# Patient Record
Sex: Male | Born: 1995 | Race: Black or African American | Hispanic: No | Marital: Single | Smoking: Former smoker
Health system: Southern US, Community
[De-identification: ages and names within clinical notes are randomized; demographics above are authoritative.]

## PROBLEM LIST (undated history)

## (undated) DIAGNOSIS — R51 Headache: Secondary | ICD-10-CM

## (undated) HISTORY — DX: Headache: R51

---
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
## 2017-08-04 ENCOUNTER — Other Ambulatory Visit: Payer: Self-pay

## 2017-08-04 ENCOUNTER — Emergency Department (HOSPITAL_COMMUNITY)
Admission: EM | Admit: 2017-08-04 | Discharge: 2017-08-05 | Disposition: A | Discharge status: Home / Self Care | Payer: 59 | Attending: Emergency Medicine | Admitting: Emergency Medicine

## 2017-08-04 ENCOUNTER — Encounter (HOSPITAL_COMMUNITY): Payer: Self-pay | Admitting: Emergency Medicine

## 2017-08-04 DIAGNOSIS — R197 Diarrhea, unspecified: Secondary | ICD-10-CM | POA: Diagnosis not present

## 2017-08-04 DIAGNOSIS — R112 Nausea with vomiting, unspecified: Secondary | ICD-10-CM | POA: Insufficient documentation

## 2017-08-04 MED ORDER — SODIUM CHLORIDE 0.9 % IV BOLUS
2000.0000 mL | Freq: Once | INTRAVENOUS | Status: AC
  Administered 2017-08-05: 2000 mL via INTRAVENOUS

## 2017-08-04 MED ORDER — ONDANSETRON HCL 4 MG/2ML IJ SOLN
4.0000 mg | Freq: Once | INTRAMUSCULAR | Status: AC
  Administered 2017-08-05: 4 mg via INTRAVENOUS
  Filled 2017-08-04: qty 2

## 2017-08-04 NOTE — ED Triage Notes (Signed)
Patient from home, started with nausea and vomiting at 330pm today and has not been able to keep anything down.  Patient is pale in triage.

## 2017-08-05 ENCOUNTER — Other Ambulatory Visit: Payer: Self-pay

## 2017-08-05 LAB — CBC
HEMATOCRIT: 46.6 % (ref 39.0–52.0)
HEMOGLOBIN: 16.2 g/dL (ref 13.0–17.0)
MCH: 30.5 pg (ref 26.0–34.0)
MCHC: 34.8 g/dL (ref 30.0–36.0)
MCV: 87.6 fL (ref 78.0–100.0)
Platelets: 304 10*3/uL (ref 150–400)
RBC: 5.32 MIL/uL (ref 4.22–5.81)
RDW: 12 % (ref 11.5–15.5)
WBC: 7.7 10*3/uL (ref 4.0–10.5)

## 2017-08-05 LAB — COMPREHENSIVE METABOLIC PANEL
ALT: 19 U/L (ref 17–63)
ANION GAP: 13 (ref 5–15)
AST: 25 U/L (ref 15–41)
Albumin: 4.3 g/dL (ref 3.5–5.0)
Alkaline Phosphatase: 71 U/L (ref 38–126)
BILIRUBIN TOTAL: 1.7 mg/dL — AB (ref 0.3–1.2)
BUN: 15 mg/dL (ref 6–20)
CO2: 22 mmol/L (ref 22–32)
Calcium: 9.4 mg/dL (ref 8.9–10.3)
Chloride: 104 mmol/L (ref 101–111)
Creatinine, Ser: 1.03 mg/dL (ref 0.61–1.24)
GFR calc Af Amer: >60 mL/min (ref >60)
Glucose, Bld: 117 mg/dL — ABNORMAL HIGH (ref 65–99)
POTASSIUM: 4.6 mmol/L (ref 3.5–5.1)
Sodium: 139 mmol/L (ref 135–145)
TOTAL PROTEIN: 7.6 g/dL (ref 6.5–8.1)

## 2017-08-05 LAB — URINALYSIS, ROUTINE W REFLEX MICROSCOPIC
BILIRUBIN URINE: NEGATIVE
Glucose, UA: NEGATIVE mg/dL
HGB URINE DIPSTICK: NEGATIVE
KETONES UR: 5 mg/dL — AB
NITRITE: NEGATIVE
Protein, ur: NEGATIVE mg/dL
SPECIFIC GRAVITY, URINE: 1.029 (ref 1.005–1.030)
pH: 7 (ref 5.0–8.0)

## 2017-08-05 LAB — LIPASE, BLOOD: Lipase: 22 U/L (ref 11–51)

## 2017-08-05 MED ORDER — ONDANSETRON 4 MG PO TBDP: ORAL_TABLET | ORAL | 0 refills | Status: DC

## 2017-08-05 MED ORDER — MORPHINE SULFATE (PF) 4 MG/ML IV SOLN
2.0000 mg | Freq: Once | INTRAVENOUS | Status: AC
  Administered 2017-08-05: 2 mg via INTRAVENOUS
  Filled 2017-08-05: qty 1

## 2017-08-05 NOTE — ED Notes (Signed)
Patient verbalizes understanding of discharge instructions. Opportunity for questioning and answers were provided. Armband removed by staff, pt discharged from ED with family.  

## 2017-08-05 NOTE — Discharge Instructions (Addendum)
1. Medications: zofran, usual home medications °2. Treatment: rest, drink plenty of fluids, advance diet slowly °3. Follow Up: Please followup with your primary doctor in 2 days for discussion of your diagnoses and further evaluation after today's visit; if you do not have a primary care doctor use the resource guide provided to find one; Please return to the ER for persistent vomiting, high fevers or worsening symptoms ° °

## 2017-08-05 NOTE — ED Provider Notes (Signed)
[facility] EMERGENCY DEPARTMENT Provider Note   CSN: [id number] Arrival date & time: 08/04/17  2343     History   Chief Complaint Chief Complaint  Patient presents with  . Emesis  . Nausea  . Diarrhea    HPI Seth Stevenson is a 22 y.o. male with a hx of migraine headache presents to the Emergency Department complaining of gradual, persistent, progressively worsening nausea, vomiting and diarrhea onset 3:30pm this afternoon.  Pt reports he was at work around lunchtime when he began having diarrhea.  He reports shortly after he began vomiting.  He reports numerous episodes of nonbloody and nonbilious emesis and watery diarrhea.  No history of abdominal surgeries, international travel or sick contacts.  He denies fevers or chills.  He endorses associated generalized abdominal discomfort and cramping.  Nothing makes his symptoms better.  He reports eating makes them worse.  No treatments prior to arrival.  The history is provided by the patient, medical records and a parent. No language interpreter was used.    Past Medical History:  Diagnosis Date  . IONGEXBM(841.3)     Patient Active Problem List   Diagnosis Date Noted  . Migraine variant 07/08/2012  . Migraine with aura 07/08/2012  . Migraine without aura 07/08/2012  . Episodic tension type headache 07/08/2012    Past Surgical History:  Procedure Laterality Date  . ADENOIDECTOMY Bilateral 2001        Home Medications    Prior to Admission medications   Medication Sig Start Date End Date Taking? Authorizing Provider  ondansetron (ZOFRAN ODT) 4 MG disintegrating tablet 4mg  ODT q4 hours prn nausea/vomit 08/05/17   Zoii Florer, Dahlia Client, PA-C    Family History Family History  Problem Relation Age of Onset  . Migraines Mother   . Heart attack Mother   . Migraines Maternal Grandmother   . Hypertension Maternal Grandmother   . Heart attack Maternal Grandmother   . Arthritis/Rheumatoid Maternal  Grandmother   . Diabetes Maternal Grandfather   . Heart attack Maternal Grandfather   . Arrhythmia Maternal Grandfather   . Hypertension Father   . Cancer Paternal Grandfather   . Migraines Cousin        maternal first cousin  . Migraines Unknown        maternal aunt onset in adolescence  . Glaucoma Unknown        maternal greatgrandfather  . Arrhythmia Maternal Aunt   . Lupus Maternal Aunt     Social History Social History   Tobacco Use  . Smoking status: Never Smoker  . Smokeless tobacco: Never Used  Substance Use Topics  . Alcohol use: No  . Drug use: No     Allergies   Patient has no known allergies.   Review of Systems Review of Systems  Constitutional: Negative for appetite change, diaphoresis, fatigue, fever and unexpected weight change.  HENT: Negative for mouth sores.   Eyes: Negative for visual disturbance.  Respiratory: Negative for cough, chest tightness, shortness of breath and wheezing.   Cardiovascular: Negative for chest pain.  Gastrointestinal: Positive for abdominal pain (cramping), diarrhea, nausea and vomiting. Negative for constipation.  Endocrine: Negative for polydipsia, polyphagia and polyuria.  Genitourinary: Negative for dysuria, frequency, hematuria and urgency.  Musculoskeletal: Negative for back pain and neck stiffness.  Skin: Negative for rash.  Allergic/Immunologic: Negative for immunocompromised state.  Neurological: Negative for syncope, light-headedness and headaches.  Hematological: Does not bruise/bleed easily.  Psychiatric/Behavioral: Negative for sleep disturbance. The patient is  not nervous/anxious.      Physical Exam Updated Vital Signs BP (!) 115/50 (BP Location: Right Arm)   Pulse 100   Temp 98.6 F (37 C) (Oral)   Resp 18   Ht 5\' 10"  (1.778 m)   Wt 77.1 kg (170 lb)   SpO2 99%   BMI 24.39 kg/m   Physical Exam  Constitutional: He appears well-developed and well-nourished. No distress.  Awake, alert, nontoxic  appearance  HENT:  Head: Normocephalic and atraumatic.  Mouth/Throat: Oropharynx is clear and moist. Mucous membranes are dry. No oropharyngeal exudate.  Eyes: Conjunctivae are normal. No scleral icterus.  Neck: Normal range of motion. Neck supple.  Cardiovascular: Normal rate, regular rhythm and intact distal pulses.  Pulmonary/Chest: Effort normal and breath sounds normal. No respiratory distress. He has no wheezes.  Equal chest expansion  Abdominal: Soft. Bowel sounds are normal. He exhibits no mass. There is no tenderness. There is no rebound and no guarding.  Musculoskeletal: Normal range of motion. He exhibits no edema.  Neurological: He is alert.  Speech is clear and goal oriented Moves extremities without ataxia  Skin: Skin is warm and dry. He is not diaphoretic.  Psychiatric: He has a normal mood and affect.  Nursing note and vitals reviewed.    ED Treatments / Results  Labs (all labs ordered are listed, but only abnormal results are displayed) Labs Reviewed  COMPREHENSIVE METABOLIC PANEL - Abnormal; Notable for the following components:      Result Value   Glucose, Bld 117 (*)    Total Bilirubin 1.7 (*)    All other components within normal limits  URINALYSIS, ROUTINE W REFLEX MICROSCOPIC - Abnormal; Notable for the following components:   Ketones, ur 5 (*)    Leukocytes, UA TRACE (*)    Bacteria, UA RARE (*)    Squamous Epithelial / LPF 0-5 (*)    All other components within normal limits  LIPASE, BLOOD  CBC     Procedures Procedures (including critical care time)  Medications Ordered in ED Medications  sodium chloride 0.9 % bolus 2,000 mL (0 mLs Intravenous Stopped 08/05/17 0231)  ondansetron (ZOFRAN) injection 4 mg (4 mg Intravenous Given 08/05/17 0016)  morphine 4 MG/ML injection 2 mg (2 mg Intravenous Given 08/05/17 0130)     Initial Impression / Assessment and Plan / ED Course  I have reviewed the triage vital signs and the nursing notes.  Pertinent  labs & imaging results that were available during my care of the patient were reviewed by me and considered in my medical decision making (see chart for details).  Clinical Course as of Aug 06 230  Wed Aug 05, 2017  0040 Labs reassuring.  No leukocytosis.  No elevation in lipase or AST/ALT.    [HM]  0040 Consistent with mild dehydration.  Ketones, ur(!): 5 [HM]  0040 Mild tachycardia  Pulse Rate: 100 [HM]  0219 Repeat abd exam is benign   [HM]    Clinical Course User Index [HM] Lucia Mccreadie, Dahlia Client, PA-C    Patient is well-appearing.  Mucous membranes are slightly dry.  Abdomen is soft without tenderness, guarding or rebound.  It is nondistended.  Will give fluids, Zofran, check labs and reassess.  2:31 AM Labs reassuring.  Abd remains nonfocal and is improved.  Patient with symptoms consistent with viral gastroenteritis.  Vitals are stable, no fever. Tolerating PO fluids.  Lungs are clear.  No focal abdominal pain, no concern for appendicitis, cholecystitis, pancreatitis, ruptured viscus, UTI, kidney  stone, or any other abdominal etiology.  Supportive therapy indicated with return if symptoms worsen.  Patient counseled.    Final Clinical Impressions(s) / ED Diagnoses   Final diagnoses:  Nausea vomiting and diarrhea    ED Discharge Orders        Ordered    ondansetron (ZOFRAN ODT) 4 MG disintegrating tablet     08/05/17 0230       Presten Joost, Dahlia ClientHannah, PA-C 08/05/17 0232    Glynn Octaveancour, Stephen, MD 08/05/17 [phone]

## 2017-08-05 NOTE — ED Notes (Signed)
ED Provider at bedside. 

## 2018-12-13 ENCOUNTER — Ambulatory Visit (HOSPITAL_COMMUNITY)
Admission: EM | Admit: 2018-12-13 | Discharge: 2018-12-13 | Disposition: A | Discharge status: Home / Self Care | Payer: Managed Care, Other (non HMO) | Attending: Emergency Medicine | Admitting: Emergency Medicine

## 2018-12-13 ENCOUNTER — Other Ambulatory Visit: Payer: Self-pay

## 2018-12-13 ENCOUNTER — Encounter (HOSPITAL_COMMUNITY): Payer: Self-pay | Admitting: Emergency Medicine

## 2018-12-13 DIAGNOSIS — Z202 Contact with and (suspected) exposure to infections with a predominantly sexual mode of transmission: Secondary | ICD-10-CM | POA: Diagnosis present

## 2018-12-13 DIAGNOSIS — Z7251 High risk heterosexual behavior: Secondary | ICD-10-CM

## 2018-12-13 MED ORDER — LIDOCAINE HCL (PF) 1 % IJ SOLN
INTRAMUSCULAR | Status: AC
  Filled 2018-12-13: qty 2

## 2018-12-13 MED ORDER — METRONIDAZOLE 500 MG PO TABS
ORAL_TABLET | ORAL | Status: AC
  Filled 2018-12-13: qty 4

## 2018-12-13 MED ORDER — CEFTRIAXONE SODIUM 250 MG IJ SOLR
250.0000 mg | Freq: Once | INTRAMUSCULAR | Status: AC
  Administered 2018-12-13: 250 mg via INTRAMUSCULAR

## 2018-12-13 MED ORDER — METRONIDAZOLE 500 MG PO TABS
ORAL_TABLET | ORAL | Status: AC
  Filled 2018-12-13: qty 1

## 2018-12-13 MED ORDER — CEFTRIAXONE SODIUM 250 MG IJ SOLR
INTRAMUSCULAR | Status: AC
  Filled 2018-12-13: qty 250

## 2018-12-13 MED ORDER — AZITHROMYCIN 250 MG PO TABS
ORAL_TABLET | ORAL | Status: AC
  Filled 2018-12-13: qty 4

## 2018-12-13 MED ORDER — AZITHROMYCIN 250 MG PO TABS
1000.0000 mg | ORAL_TABLET | Freq: Once | ORAL | Status: AC
  Administered 2018-12-13: 1000 mg via ORAL

## 2018-12-13 MED ORDER — METRONIDAZOLE 500 MG PO TABS
2000.0000 mg | ORAL_TABLET | Freq: Once | ORAL | Status: AC
  Administered 2018-12-13: 12:00:00 2000 mg via ORAL

## 2018-12-13 NOTE — ED Provider Notes (Signed)
[facility]    CSN: [id number] Arrival date & time: 12/13/18  1051     History   Chief Complaint Chief Complaint  Patient presents with  . SEXUALLY TRANSMITTED DISEASE    HPI Seth Stevenson is a 23 y.o. male.   Patient presents with request for STD testing and treatment.  He found out that his partner is positive for chlamydia and trichomonas; they did not use condoms.  He denies penile discharge, lesions, testicular pain, abdominal pain, dysuria, fever, or other symptoms.    The history is provided by the patient.    Past Medical History:  Diagnosis Date  . WUJWJXBJ(478.2Headache(784.0)     Patient Active Problem List   Diagnosis Date Noted  . Migraine variant 07/08/2012  . Migraine with aura 07/08/2012  . Migraine without aura 07/08/2012  . Episodic tension type headache 07/08/2012    Past Surgical History:  Procedure Laterality Date  . ADENOIDECTOMY Bilateral 2001       Home Medications    Prior to Admission medications   Medication Sig Start Date End Date Taking? Authorizing Provider  ondansetron (ZOFRAN ODT) 4 MG disintegrating tablet 4mg  ODT q4 hours prn nausea/vomit 08/05/17   Muthersbaugh, Dahlia ClientHannah, PA-C    Family History Family History  Problem Relation Age of Onset  . Migraines Mother   . Heart attack Mother   . Migraines Maternal Grandmother   . Hypertension Maternal Grandmother   . Heart attack Maternal Grandmother   . Arthritis/Rheumatoid Maternal Grandmother   . Diabetes Maternal Grandfather   . Heart attack Maternal Grandfather   . Arrhythmia Maternal Grandfather   . Hypertension Father   . Cancer Paternal Grandfather   . Migraines Cousin        maternal first cousin  . Migraines Other        maternal aunt onset in adolescence  . Glaucoma Other        maternal greatgrandfather  . Arrhythmia Maternal Aunt   . Lupus Maternal Aunt     Social History Social History   Tobacco Use  . Smoking status: Never Smoker  . Smokeless tobacco:  Never Used  Substance Use Topics  . Alcohol use: No  . Drug use: No     Allergies   Patient has no known allergies.   Review of Systems Review of Systems  Constitutional: Negative for chills and fever.  HENT: Negative for ear pain and sore throat.   Eyes: Negative for pain and visual disturbance.  Respiratory: Negative for cough and shortness of breath.   Cardiovascular: Negative for chest pain and palpitations.  Gastrointestinal: Negative for abdominal pain and vomiting.  Genitourinary: Negative for discharge, dysuria, flank pain, hematuria and testicular pain.  Musculoskeletal: Negative for arthralgias and back pain.  Skin: Negative for color change and rash.  Neurological: Negative for seizures and syncope.  All other systems reviewed and are negative.    Physical Exam Triage Vital Signs ED Triage Vitals  Enc Vitals Group     BP      Pulse      Resp      Temp      Temp src      SpO2      Weight      Height      Head Circumference      Peak Flow      Pain Score      Pain Loc      Pain Edu?  Excl. in [place]?    No data found.  Updated Vital Signs BP 106/62 (BP Location: Left Arm)   Pulse (!) 58   Temp 97.8 F (36.6 C) (Temporal)   Resp 16   SpO2 100%   Visual Acuity Right Eye Distance:   Left Eye Distance:   Bilateral Distance:    Right Eye Near:   Left Eye Near:    Bilateral Near:     Physical Exam Vitals signs and nursing note reviewed.  Constitutional:      Appearance: He is well-developed.  HENT:     Head: Normocephalic and atraumatic.  Eyes:     Conjunctiva/sclera: Conjunctivae normal.  Neck:     Musculoskeletal: Neck supple.  Cardiovascular:     Rate and Rhythm: Normal rate and regular rhythm.     Heart sounds: No murmur.  Pulmonary:     Effort: Pulmonary effort is normal. No respiratory distress.     Breath sounds: Normal breath sounds.  Abdominal:     Palpations: Abdomen is soft.     Tenderness: There is no abdominal  tenderness. There is no right CVA tenderness, left CVA tenderness, guarding or rebound.  Genitourinary:    Penis: Normal.      Scrotum/Testes: Normal.  Skin:    General: Skin is warm and dry.     Findings: No rash.  Neurological:     Mental Status: He is alert.      UC Treatments / Results  Labs (all labs ordered are listed, but only abnormal results are displayed) Labs Reviewed  RPR  HIV ANTIBODY (ROUTINE TESTING W REFLEX)  URINE CYTOLOGY ANCILLARY ONLY    EKG   Radiology No results found.  Procedures Procedures (including critical care time)  Medications Ordered in UC Medications  metroNIDAZOLE (FLAGYL) tablet 2,000 mg (has no administration in time range)  azithromycin (ZITHROMAX) tablet 1,000 mg (has no administration in time range)  cefTRIAXone (ROCEPHIN) injection 250 mg (has no administration in time range)    Initial Impression / Assessment and Plan / UC Course  I have reviewed the triage vital signs and the nursing notes.  Pertinent labs & imaging results that were available during my care of the patient were reviewed by me and considered in my medical decision making (see chart for details).    Unprotected sex, exposure to STD.  Treated today with Rocephin, Zithromax, metronidazole.  Urine sent for trichomonas, gonorrhea, chlamydia.  Blood sent for HIV and RPR.  Discussed safe sex practices with patient.  Discussed that his sexual partners will need to be treated if his test results come back positive.  Instructed patient to abstain from sex for 7 days.     Final Clinical Impressions(s) / UC Diagnoses   Final diagnoses:  Unprotected sex  Exposure to STD     Discharge Instructions     You were treated today for STDs; you were given Rocephin, Zithromax, and metronidazole.    We will call you if any of your STD tests come back positive.  All of your sexual partners will need to be treated if your tests are positive.          ED Prescriptions     None     Controlled Substance Prescriptions [place] Controlled Substance Registry consulted? Not Applicable   Sharion Balloon, NP 12/13/18 1124

## 2018-12-13 NOTE — Discharge Instructions (Addendum)
You were treated today for STDs; you were given Rocephin, Zithromax, and metronidazole.    We will call you if any of your STD tests come back positive.  All of your sexual partners will need to be treated if your tests are positive.

## 2018-12-13 NOTE — ED Triage Notes (Signed)
Patient denies symptoms: no burning, no discharge  Patient reports partner is positive for chlamydia and trich

## 2018-12-14 LAB — RPR: RPR Ser Ql: NONREACTIVE

## 2018-12-14 LAB — URINE CYTOLOGY ANCILLARY ONLY
Chlamydia: NEGATIVE
Neisseria Gonorrhea: NEGATIVE
Trichomonas: NEGATIVE

## 2018-12-14 LAB — HIV ANTIBODY (ROUTINE TESTING W REFLEX): HIV Screen 4th Generation wRfx: NONREACTIVE

## 2019-01-27 ENCOUNTER — Encounter (HOSPITAL_COMMUNITY): Payer: Self-pay | Admitting: Emergency Medicine

## 2019-01-27 ENCOUNTER — Other Ambulatory Visit: Payer: Self-pay

## 2019-01-27 ENCOUNTER — Ambulatory Visit (INDEPENDENT_AMBULATORY_CARE_PROVIDER_SITE_OTHER)
Admission: EM | Admit: 2019-01-27 | Discharge: 2019-01-27 | Disposition: A | Discharge status: Home / Self Care | Payer: Managed Care, Other (non HMO) | Source: Home / Self Care | Attending: Internal Medicine | Admitting: Internal Medicine

## 2019-01-27 ENCOUNTER — Emergency Department (HOSPITAL_COMMUNITY): Payer: Managed Care, Other (non HMO)

## 2019-01-27 ENCOUNTER — Emergency Department (HOSPITAL_COMMUNITY)
Admission: EM | Admit: 2019-01-27 | Discharge: 2019-01-27 | Disposition: A | Discharge status: Home / Self Care | Payer: Managed Care, Other (non HMO) | Attending: Emergency Medicine | Admitting: Emergency Medicine

## 2019-01-27 DIAGNOSIS — M940 Chondrocostal junction syndrome [Tietze]: Secondary | ICD-10-CM | POA: Diagnosis not present

## 2019-01-27 DIAGNOSIS — Z5321 Procedure and treatment not carried out due to patient leaving prior to being seen by health care provider: Secondary | ICD-10-CM | POA: Insufficient documentation

## 2019-01-27 DIAGNOSIS — R0789 Other chest pain: Secondary | ICD-10-CM | POA: Diagnosis present

## 2019-01-27 LAB — CBC
HCT: 44.2 % (ref 39.0–52.0)
Hemoglobin: 15.6 g/dL (ref 13.0–17.0)
MCH: 32.1 pg (ref 26.0–34.0)
MCHC: 35.3 g/dL (ref 30.0–36.0)
MCV: 90.9 fL (ref 80.0–100.0)
Platelets: 304 10*3/uL (ref 150–400)
RBC: 4.86 MIL/uL (ref 4.22–5.81)
RDW: 11.5 % (ref 11.5–15.5)
WBC: 4.9 10*3/uL (ref 4.0–10.5)
nRBC: 0 % (ref 0.0–0.2)

## 2019-01-27 LAB — BASIC METABOLIC PANEL
Anion gap: 11 (ref 5–15)
BUN: 17 mg/dL (ref 6–20)
CO2: 24 mmol/L (ref 22–32)
Calcium: 9.5 mg/dL (ref 8.9–10.3)
Chloride: 103 mmol/L (ref 98–111)
Creatinine, Ser: 0.93 mg/dL (ref 0.61–1.24)
GFR calc Af Amer: >60 mL/min (ref >60)
GFR calc non Af Amer: >60 mL/min (ref >60)
Glucose, Bld: 87 mg/dL (ref 70–99)
Potassium: 4 mmol/L (ref 3.5–5.1)
Sodium: 138 mmol/L (ref 135–145)

## 2019-01-27 LAB — TROPONIN I (HIGH SENSITIVITY)
Troponin I (High Sensitivity): 3 ng/L (ref <18)
Troponin I (High Sensitivity): 3 ng/L (ref <18)

## 2019-01-27 MED ORDER — IBUPROFEN 600 MG PO TABS: 600.0000 mg | ORAL_TABLET | Freq: Four times a day (QID) | ORAL | 0 refills | Status: DC | PRN

## 2019-01-27 MED ORDER — KETOROLAC TROMETHAMINE 30 MG/ML IJ SOLN
30.0000 mg | Freq: Once | INTRAMUSCULAR | Status: AC
  Administered 2019-01-27: 30 mg via INTRAMUSCULAR

## 2019-01-27 MED ORDER — KETOROLAC TROMETHAMINE 30 MG/ML IJ SOLN: 30.0000 mg | Freq: Once | INTRAMUSCULAR | Status: DC

## 2019-01-27 MED ORDER — KETOROLAC TROMETHAMINE 30 MG/ML IJ SOLN
INTRAMUSCULAR | Status: AC
  Filled 2019-01-27: qty 1

## 2019-01-27 NOTE — ED Notes (Signed)
Pt states he can't wait any longer and when he found there were several people still in front he states he is going to lay down. LWBS

## 2019-01-27 NOTE — ED Provider Notes (Signed)
[place]    CSN: [id number] Arrival date & time: 01/27/19  1100      History   Chief Complaint Chief Complaint  Patient presents with  . Chest Pain    HPI Seth Stevenson is a 23 y.o. male with past medical history comes to urgent care with complains of sharp precordial chest pain of 2-3 days duration. Pain is sharp, reproducible, worse on palpation and movement. Patient has not tried any over the counter medications.  No trauma to the chest. Patient was seen in the emergency department.  He had a CBC, BMP, troponins, chest x-ray done.  All these tests were negative.  Patient left before he was seen by provider.Marland Kitchen   HPI  Past Medical History:  Diagnosis Date  . HWEXHBZJ(696.7)     Patient Active Problem List   Diagnosis Date Noted  . Migraine variant 07/08/2012  . Migraine with aura 07/08/2012  . Migraine without aura 07/08/2012  . Episodic tension type headache 07/08/2012    Past Surgical History:  Procedure Laterality Date  . ADENOIDECTOMY Bilateral 2001       Home Medications    Prior to Admission medications   Medication Sig Start Date End Date Taking? Authorizing Provider  ibuprofen (ADVIL) 600 MG tablet Take 1 tablet (600 mg total) by mouth every 6 (six) hours as needed. 01/27/19   Chase Picket, MD  ondansetron (ZOFRAN ODT) 4 MG disintegrating tablet 4mg  ODT q4 hours prn nausea/vomit 08/05/17   Muthersbaugh, Jarrett Soho, PA-C    Family History Family History  Problem Relation Age of Onset  . Migraines Mother   . Heart attack Mother   . Migraines Maternal Grandmother   . Hypertension Maternal Grandmother   . Heart attack Maternal Grandmother   . Arthritis/Rheumatoid Maternal Grandmother   . Diabetes Maternal Grandfather   . Heart attack Maternal Grandfather   . Arrhythmia Maternal Grandfather   . Hypertension Father   . Cancer Paternal Grandfather   . Migraines Cousin        maternal first cousin  . Migraines Other        maternal  aunt onset in adolescence  . Glaucoma Other        maternal greatgrandfather  . Arrhythmia Maternal Aunt   . Lupus Maternal Aunt     Social History Social History   Tobacco Use  . Smoking status: Current Every Day Smoker  . Smokeless tobacco: Never Used  Substance Use Topics  . Alcohol use: Yes  . Drug use: No     Allergies   Patient has no known allergies.   Review of Systems Review of Systems  Constitutional: Negative.   HENT: Negative.   Respiratory: Negative for cough, chest tightness, shortness of breath and wheezing.   Cardiovascular: Positive for chest pain. Negative for palpitations.  Gastrointestinal: Negative.   Genitourinary: Negative.   Musculoskeletal: Negative.      Physical Exam Triage Vital Signs ED Triage Vitals [01/27/19 1122]  Enc Vitals Group     BP 138/74     Pulse Rate 60     Resp 17     Temp 97.6 F (36.4 C)     Temp Source Tympanic     SpO2 99 %     Weight      Height      Head Circumference      Peak Flow      Pain Score 9     Pain Loc  Pain Edu?      Excl. in [place]?    No data found.  Updated Vital Signs BP 138/74 (BP Location: Left Arm)   Pulse 60   Temp 97.6 F (36.4 C) (Tympanic)   Resp 17   SpO2 99%   Visual Acuity Right Eye Distance:   Left Eye Distance:   Bilateral Distance:    Right Eye Near:   Left Eye Near:    Bilateral Near:     Physical Exam Vitals signs and nursing note reviewed.  Constitutional:      Appearance: He is well-developed.  Cardiovascular:     Rate and Rhythm: Normal rate and regular rhythm.  Pulmonary:     Effort: Pulmonary effort is normal.     Breath sounds: Normal breath sounds.     Comments: Tenderness on palpation in the left costochondral area. Neurological:     Mental Status: He is alert.      UC Treatments / Results  Labs (all labs ordered are listed, but only abnormal results are displayed) Labs Reviewed - No data to display  EKG   Radiology Dg Chest 2 View   Result Date: 01/27/2019 CLINICAL DATA:  Initial evaluation for acute chest pain. EXAM: CHEST - 2 VIEW COMPARISON:  None. FINDINGS: The cardiac and mediastinal silhouettes are within normal limits. The lungs are normally inflated. No airspace consolidation, pleural effusion, or pulmonary edema is identified. There is no pneumothorax. No acute osseous abnormality identified. IMPRESSION: No active cardiopulmonary disease. Electronically Signed   By: Rise Mu M.D.   On: 01/27/2019 02:05    Procedures Procedures (including critical care time)  Medications Ordered in UC Medications  ketorolac (TORADOL) 30 MG/ML injection 30 mg (30 mg Intramuscular Given 01/27/19 1152)  ketorolac (TORADOL) 30 MG/ML injection (has no administration in time range)    Initial Impression / Assessment and Plan / UC Course  I have reviewed the triage vital signs and the nursing notes.  Pertinent labs & imaging results that were available during my care of the patient were reviewed by me and considered in my medical decision making (see chart for details).     1.  Costochondritis: Warm compress Ibuprofen 600 mg every 6 hours as needed Cardiac work-up was unremarkable. If patient experiences worsening of his symptoms he is advised to return to the urgent care to be reevaluated. Final Clinical Impressions(s) / UC Diagnoses   Final diagnoses:  Costochondritis   Discharge Instructions   None    ED Prescriptions    Medication Sig Dispense Auth. Provider   ibuprofen (ADVIL) 600 MG tablet Take 1 tablet (600 mg total) by mouth every 6 (six) hours as needed. 30 tablet Lamptey, Britta Mccreedy, MD     PDMP not reviewed this encounter.   Merrilee Jansky, MD 01/27/19 1430

## 2019-01-27 NOTE — ED Triage Notes (Signed)
C/o central and left sided chest tightness since Sunday.  Denies SOB, nausea, and vomiting.

## 2019-01-27 NOTE — ED Triage Notes (Signed)
Pt here for mid sternal CP that is worse with movement; pt seen in ED this am for same but he LWBS although he multiple normal blood draws

## 2019-08-10 ENCOUNTER — Emergency Department (HOSPITAL_COMMUNITY)
Admission: EM | Admit: 2019-08-10 | Discharge: 2019-08-11 | Disposition: A | Discharge status: Home / Self Care | Payer: Managed Care, Other (non HMO) | Attending: Emergency Medicine | Admitting: Emergency Medicine

## 2019-08-10 ENCOUNTER — Encounter (HOSPITAL_COMMUNITY): Payer: Self-pay | Admitting: Emergency Medicine

## 2019-08-10 DIAGNOSIS — R519 Headache, unspecified: Secondary | ICD-10-CM | POA: Insufficient documentation

## 2019-08-10 DIAGNOSIS — Z5321 Procedure and treatment not carried out due to patient leaving prior to being seen by health care provider: Secondary | ICD-10-CM | POA: Insufficient documentation
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
## 2019-12-21 ENCOUNTER — Other Ambulatory Visit: Payer: Self-pay

## 2019-12-21 ENCOUNTER — Encounter (HOSPITAL_COMMUNITY): Payer: Self-pay

## 2019-12-21 ENCOUNTER — Ambulatory Visit (HOSPITAL_COMMUNITY)
Admission: EM | Admit: 2019-12-21 | Discharge: 2019-12-21 | Disposition: A | Discharge status: Home / Self Care | Payer: Managed Care, Other (non HMO) | Attending: Family Medicine | Admitting: Family Medicine

## 2019-12-21 DIAGNOSIS — Z113 Encounter for screening for infections with a predominantly sexual mode of transmission: Secondary | ICD-10-CM | POA: Diagnosis present

## 2019-12-21 DIAGNOSIS — B356 Tinea cruris: Secondary | ICD-10-CM | POA: Diagnosis present

## 2019-12-21 LAB — HIV ANTIBODY (ROUTINE TESTING W REFLEX): HIV Screen 4th Generation wRfx: NONREACTIVE

## 2019-12-21 NOTE — ED Provider Notes (Signed)
[facility]   [id number] 12/21/19 Arrival Time: 0913  ASSESSMENT & PLAN:  1. Tinea cruris       Discharge Instructions     Begin using over the counter Clotrimazole cream twice daily.  We have sent testing for sexually transmitted infections. We will notify you of any positive results once they are received. If required, we will prescribe any medications you might need.  Please refrain from all sexual activity for at least the next seven days.      Pending: Labs Reviewed  RPR  HIV ANTIBODY (ROUTINE TESTING W REFLEX)  CYTOLOGY, (ORAL, ANAL, URETHRAL) ANCILLARY ONLY    Keep skin clean and dry. Will follow up with PCP or here if worsening or failing to improve as anticipated. Reviewed expectations re: course of current medical issues. Questions answered. Outlined signs and symptoms indicating need for more acute intervention. Patient verbalized understanding. After Visit Summary given.   SUBJECTIVE:  Seth Stevenson is a 24 y.o. male who presents with a skin complaint. Rash; left groin; several days to week; sweats a lot at work; area is irritated/itchy at times. Afebrile. No h/o similar. OTC Neosporin without relief. Also requests STI testing; no symptoms. One male partner.   OBJECTIVE: Vitals:   12/21/19 1006 12/21/19 1007  BP: 135/75   Pulse: (!) 55   Resp: 16   Temp: 98 F (36.7 C)   TempSrc: Oral   SpO2: 100%   Weight:  77.1 kg  Height:  5\' 10"  (1.778 m)    General appearance: alert; no distress HEENT: [place]; AT Neck: supple with FROM Lungs: unlabored Extremities: no edema; moves all extremities normally Skin: warm and dry; signs of infection: no; left groin with scaly patches of irritated/broken skin consistent with tinea GU: normal Psychological: alert and cooperative; normal mood and affect  No Known Allergies  Past Medical History:  Diagnosis Date  . Headache(784.0)    Social History   Socioeconomic History  . Marital status:  Single    Spouse name: Not on file  . Number of children: Not on file  . Years of education: Not on file  . Highest education level: Not on file  Occupational History  . Not on file  Tobacco Use  . Smoking status: Former  . Smokeless tobacco: Never Used  Substance and Sexual Activity  . Alcohol use: Yes    Alcohol/week: 8.0 standard drinks    Types: 8 Shots of liquor per week  . Drug use: No  . Sexual activity: Never  Other Topics Concern  . Not on file  Social History Narrative  . Not on file   Social Determinants of Health   Financial Resource Strain:   . Difficulty of Paying Living Expenses:   Food Insecurity:   . Worried About Games developer in the Last Year:   . Programme researcher, broadcasting/film/video in the Last Year:   Transportation Needs:   . Barista (Medical):   Freight forwarder Lack of Transportation (Non-Medical):   Physical Activity:   . Days of Exercise per Week:   . Minutes of Exercise per Session:   Stress:   . Feeling of Stress :   Social Connections:   . Frequency of Communication with Friends and Family:   . Frequency of Social Gatherings with Friends and Family:   . Attends Religious Services:   . Active Member of Clubs or Organizations:   . Attends Marland Kitchen Meetings:   Banker Marital Status:  Intimate Partner Violence:   . Fear of Current or Ex-Partner:   . Emotionally Abused:   Marland Kitchen Physically Abused:   . Sexually Abused:    Family History  Problem Relation Age of Onset  . Migraines Mother   . Heart attack Mother   . Migraines Maternal Grandmother   . Hypertension Maternal Grandmother   . Heart attack Maternal Grandmother   . Arthritis/Rheumatoid Maternal Grandmother   . Diabetes Maternal Grandfather   . Heart attack Maternal Grandfather   . Arrhythmia Maternal Grandfather   . Hypertension Father   . Cancer Paternal Grandfather   . Migraines Cousin        maternal first cousin  . Migraines Other        maternal aunt onset in adolescence    . Glaucoma Other        maternal greatgrandfather  . Arrhythmia Maternal Aunt   . Lupus Maternal Aunt    Past Surgical History:  Procedure Laterality Date  . ADENOIDECTOMY Bilateral 2001     Caralynn Gelber, MD 12/21/19 1021

## 2019-12-21 NOTE — Discharge Instructions (Addendum)
Begin using over the counter Clotrimazole cream twice daily.  We have sent testing for sexually transmitted infections. We will notify you of any positive results once they are received. If required, we will prescribe any medications you might need.  Please refrain from all sexual activity for at least the next seven days.

## 2019-12-21 NOTE — ED Notes (Signed)
Discussed activation of my chart account

## 2019-12-21 NOTE — ED Triage Notes (Signed)
Pt c/o poss poison oak on left groin areax2 days. Pt states was doing yard work. Pt has purplish area in left groin area. Pt states it itches sometimes.

## 2019-12-22 LAB — CYTOLOGY, (ORAL, ANAL, URETHRAL) ANCILLARY ONLY
Chlamydia: NEGATIVE
Comment: NEGATIVE
Comment: NEGATIVE
Comment: NORMAL
Neisseria Gonorrhea: NEGATIVE
Trichomonas: NEGATIVE

## 2019-12-22 LAB — RPR: RPR Ser Ql: NONREACTIVE — AB

## 2020-01-16 ENCOUNTER — Ambulatory Visit (HOSPITAL_COMMUNITY)
Admission: EM | Admit: 2020-01-16 | Discharge: 2020-01-16 | Disposition: A | Discharge status: Home / Self Care | Payer: Managed Care, Other (non HMO) | Attending: Family Medicine | Admitting: Family Medicine

## 2020-01-16 ENCOUNTER — Encounter (HOSPITAL_COMMUNITY): Payer: Self-pay | Admitting: Emergency Medicine

## 2020-01-16 ENCOUNTER — Other Ambulatory Visit: Payer: Self-pay

## 2020-01-16 DIAGNOSIS — B356 Tinea cruris: Secondary | ICD-10-CM | POA: Diagnosis not present

## 2020-01-16 NOTE — ED Triage Notes (Signed)
Patient has a recurrent groin rash.  Patient said it did improve last visit.  But, rash has returned and thinks it is something else

## 2020-01-17 NOTE — ED Provider Notes (Signed)
[facility]   [id number] 01/16/20 Arrival Time: 1122  ASSESSMENT & PLAN:  1. Tinea cruris     Has resolved. Answered questions regarding his worries over hsv. No signs of hsv.   Reviewed expectations re: course of current medical issues. Questions answered. Outlined signs and symptoms indicating need for more acute intervention. Patient verbalized understanding. After Visit Summary given.   SUBJECTIVE:  Seth Stevenson is a 24 y.o. male who is here for f/u tinea cruris. Has resolved. Reports only sexual partner was dx with hsv. He has no lesions but has questions regarding hsv.     OBJECTIVE: Vitals:   01/16/20 1358  BP: 121/74  Pulse: (!) 54  Resp: 18  Temp: 98.5 F (36.9 C)  TempSrc: Oral  SpO2: 100%    General appearance: alert; no distress HEENT: [place]; AT Neck: supple with FROM Lungs: clear to auscultation bilaterally Heart: regular rate and rhythm Extremities: no edema; moves all extremities normally Skin: warm and dry; skin darkening around areas where tinea cruris was; no signs of active infection; no lesions Psychological: alert and cooperative; normal mood and affect  No Known Allergies  Past Medical History:  Diagnosis Date  . Headache(784.0)    Social History   Socioeconomic History  . Marital status: Single    Spouse name: Not on file  . Number of children: Not on file  . Years of education: Not on file  . Highest education level: Not on file  Occupational History  . Not on file  Tobacco Use  . Smoking status: Former Games developer  . Smokeless tobacco: Never Used  Substance and Sexual Activity  . Alcohol use: Yes    Alcohol/week: 8.0 standard drinks    Types: 8 Shots of liquor per week  . Drug use: No  . Sexual activity: Never  Other Topics Concern  . Not on file  Social History Narrative  . Not on file   Social Determinants of Health   Financial Resource Strain:   . Difficulty of Paying Living Expenses: Not on file  Food  Insecurity:   . Worried About Programme researcher, broadcasting/film/video in the Last Year: Not on file  . Ran Out of Food in the Last Year: Not on file  Transportation Needs:   . Lack of Transportation (Medical): Not on file  . Lack of Transportation (Non-Medical): Not on file  Physical Activity:   . Days of Exercise per Week: Not on file  . Minutes of Exercise per Session: Not on file  Stress:   . Feeling of Stress : Not on file  Social Connections:   . Frequency of Communication with Friends and Family: Not on file  . Frequency of Social Gatherings with Friends and Family: Not on file  . Attends Religious Services: Not on file  . Active Member of Clubs or Organizations: Not on file  . Attends Banker Meetings: Not on file  . Marital Status: Not on file  Intimate Partner Violence:   . Fear of Current or Ex-Partner: Not on file  . Emotionally Abused: Not on file  . Physically Abused: Not on file  . Sexually Abused: Not on file   Family History  Problem Relation Age of Onset  . Migraines Mother   . Heart attack Mother   . Migraines Maternal Grandmother   . Hypertension Maternal Grandmother   . Heart attack Maternal Grandmother   . Arthritis/Rheumatoid Maternal Grandmother   . Diabetes Maternal Grandfather   . Heart  attack Maternal Grandfather   . Arrhythmia Maternal Grandfather   . Hypertension Father   . Cancer Paternal Grandfather   . Migraines Cousin        maternal first cousin  . Migraines Other        maternal aunt onset in adolescence  . Glaucoma Other        maternal greatgrandfather  . Arrhythmia Maternal Aunt   . Lupus Maternal Aunt    Past Surgical History:  Procedure Laterality Date  . ADENOIDECTOMY Bilateral Sheral Flow, MD 01/17/20 [phone]

## 2020-02-17 ENCOUNTER — Encounter (HOSPITAL_COMMUNITY): Payer: Self-pay | Admitting: Emergency Medicine

## 2020-02-17 ENCOUNTER — Ambulatory Visit (HOSPITAL_COMMUNITY)
Admission: EM | Admit: 2020-02-17 | Discharge: 2020-02-17 | Disposition: A | Discharge status: Home / Self Care | Payer: Managed Care, Other (non HMO) | Attending: Emergency Medicine | Admitting: Emergency Medicine

## 2020-02-17 ENCOUNTER — Other Ambulatory Visit: Payer: Self-pay

## 2020-02-17 DIAGNOSIS — Z202 Contact with and (suspected) exposure to infections with a predominantly sexual mode of transmission: Secondary | ICD-10-CM | POA: Diagnosis not present

## 2020-02-17 DIAGNOSIS — Z113 Encounter for screening for infections with a predominantly sexual mode of transmission: Secondary | ICD-10-CM | POA: Diagnosis not present

## 2020-02-17 LAB — POCT URINALYSIS DIPSTICK, ED / UC
Bilirubin Urine: NEGATIVE
Glucose, UA: NEGATIVE mg/dL
Hgb urine dipstick: NEGATIVE
Ketones, ur: NEGATIVE mg/dL
Leukocytes,Ua: NEGATIVE
Nitrite: NEGATIVE
Protein, ur: NEGATIVE mg/dL
Specific Gravity, Urine: 1.025 (ref 1.005–1.030)
Urobilinogen, UA: 0.2 mg/dL (ref 0.0–1.0)
pH: 7 (ref 5.0–8.0)

## 2020-02-17 MED ORDER — VALACYCLOVIR HCL 1 G PO TABS: 1000.0000 mg | ORAL_TABLET | Freq: Two times a day (BID) | ORAL | 0 refills | Status: AC

## 2020-02-17 NOTE — ED Provider Notes (Signed)
Emergency Department Provider Note  ____________________________________________  Time seen: Approximately 11:22 AM  I have reviewed the triage vital signs and the nursing notes.   HISTORY  Chief Complaint Exposure to STD   Historian Patient    HPI Seth Stevenson is a 24 y.o. male presents to the emergency department with an erythematous, vesicular rash of penis after his girlfriend wrote him a note that said that she had genital herpes. Patient denies discharge from the penis, dysuria, increased urinary frequency or low back pain. He has been afebrile at home. He states that affected area presents with a burning type pain. He denies any history of genital herpes in the past. No other alleviating measures have been attempted.   Past Medical History:  Diagnosis Date  . Headache(784.0)      Immunizations up to date:  Yes.     Past Medical History:  Diagnosis Date  . BMWUXLKG(401.0)     Patient Active Problem List   Diagnosis Date Noted  . Migraine variant 07/08/2012  . Migraine with aura 07/08/2012  . Migraine without aura 07/08/2012  . Episodic tension type headache 07/08/2012    Past Surgical History:  Procedure Laterality Date  . ADENOIDECTOMY Bilateral 2001    Prior to Admission medications   Medication Sig Start Date End Date Taking? Authorizing Provider  ibuprofen (ADVIL) 600 MG tablet Take 1 tablet (600 mg total) by mouth every 6 (six) hours as needed. 01/27/19   Lamptey, Britta Mccreedy, MD  valACYclovir (VALTREX) 1000 MG tablet Take 1 tablet (1,000 mg total) by mouth 2 (two) times daily for 10 days. 02/17/20 02/27/20  Orvil Feil, PA-C    Allergies Patient has no known allergies.  Family History  Problem Relation Age of Onset  . Migraines Mother   . Heart attack Mother   . Migraines Maternal Grandmother   . Hypertension Maternal Grandmother   . Heart attack Maternal Grandmother   . Arthritis/Rheumatoid Maternal Grandmother   . Diabetes  Maternal Grandfather   . Heart attack Maternal Grandfather   . Arrhythmia Maternal Grandfather   . Hypertension Father   . Cancer Paternal Grandfather   . Migraines Cousin        maternal first cousin  . Migraines Other        maternal aunt onset in adolescence  . Glaucoma Other        maternal greatgrandfather  . Arrhythmia Maternal Aunt   . Lupus Maternal Aunt     Social History Social History   Tobacco Use  . Smoking status: Former Games developer  . Smokeless tobacco: Never Used  Substance Use Topics  . Alcohol use: Yes    Alcohol/week: 8.0 standard drinks    Types: 8 Shots of liquor per week  . Drug use: No     Review of Systems  Constitutional: No fever/chills Eyes:  No discharge ENT: No upper respiratory complaints. Respiratory: no cough. No SOB/ use of accessory muscles to breath Gastrointestinal:   No nausea, no vomiting.  No diarrhea.  No constipation. Musculoskeletal: Negative for musculoskeletal pain. Skin: Patient has rash.    ____________________________________________   PHYSICAL EXAM:  VITAL SIGNS: ED Triage Vitals [02/17/20 0930]  Enc Vitals Group     BP 124/78     Pulse Rate (!) 56     Resp 16     Temp 98.3 F (36.8 C)     Temp Source Oral     SpO2 98 %     Weight  Height      Head Circumference      Peak Flow      Pain Score 0     Pain Loc      Pain Edu?      Excl. in GC?      Constitutional: Alert and oriented. Well appearing and in no acute distress. Eyes: Conjunctivae are normal. PERRL. EOMI. Head: Atraumatic. Cardiovascular: Normal rate, regular rhythm. Normal S1 and S2.  Good peripheral circulation. Respiratory: Normal respiratory effort without tachypnea or retractions. Lungs CTAB. Good air entry to the bases with no decreased or absent breath sounds Gastrointestinal: Bowel sounds x 4 quadrants. Soft and nontender to palpation. No guarding or rigidity. No distention. Musculoskeletal: Full range of motion to all extremities. No  obvious deformities noted Neurologic:  Normal for age. No gross focal neurologic deficits are appreciated.  Skin: Patient has erythematous, vesicular rash of penis. Psychiatric: Mood and affect are normal for age. Speech and behavior are normal.   ____________________________________________   LABS (all labs ordered are listed, but only abnormal results are displayed)  Labs Reviewed  URINE CULTURE  POCT URINALYSIS DIPSTICK, ED / UC  CYTOLOGY, (ORAL, ANAL, URETHRAL) ANCILLARY ONLY   ____________________________________________  EKG   ____________________________________________  RADIOLOGY   No results found.  ____________________________________________    PROCEDURES  Procedure(s) performed:     Procedures     Medications - No data to display   ____________________________________________   INITIAL IMPRESSION / ASSESSMENT AND PLAN / ED COURSE  Pertinent labs & imaging results that were available during my care of the patient were reviewed by me and considered in my medical decision making (see chart for details).      Assessment and plan Genital herpes 24 year old male presents to the emergency department with an erythematous, vesicular rash to penis and recent contact with a sexual partner who is known to have genital herpes. Sendoff testing for gonorrhea, chlamydia and trichomoniasis is in process at this time as well as urine culture. Urinalysis did not suggest UTI. Patient would like to wait before empiric treatment for gonorrhea, chlamydia and trichomoniasis. Patient was started on Valtrex twice daily for the next 10 days for likely genital herpes. Return precautions were given to return with new or worsening symptoms.    ____________________________________________  FINAL CLINICAL IMPRESSION(S) / ED DIAGNOSES  Final diagnoses:  STD exposure      NEW MEDICATIONS STARTED DURING THIS VISIT:  ED Discharge Orders         Ordered     valACYclovir (VALTREX) 1000 MG tablet  2 times daily        02/17/20 1030              This chart was dictated using voice recognition software/Dragon. Despite best efforts to proofread, errors can occur which can change the meaning. Any change was purely unintentional.     Orvil Feil, PA-C 02/17/20 1126

## 2020-02-17 NOTE — ED Triage Notes (Signed)
Pt presents for std testing after possible exposure.

## 2020-02-17 NOTE — Discharge Instructions (Signed)
Take Valtrex twice daily for the next 10 days. If testing at urgent care is positive, you will have to return for treatment.

## 2020-02-18 LAB — URINE CULTURE: Culture: <10000 — AB

## 2020-02-20 LAB — CYTOLOGY, (ORAL, ANAL, URETHRAL) ANCILLARY ONLY
Chlamydia: NEGATIVE
Comment: NEGATIVE
Comment: NEGATIVE
Comment: NORMAL
Neisseria Gonorrhea: NEGATIVE
Trichomonas: NEGATIVE

## 2020-03-11 ENCOUNTER — Encounter (HOSPITAL_COMMUNITY): Payer: Self-pay | Admitting: Emergency Medicine

## 2020-03-11 ENCOUNTER — Emergency Department (HOSPITAL_COMMUNITY)
Admission: EM | Admit: 2020-03-11 | Discharge: 2020-03-11 | Disposition: A | Discharge status: Home / Self Care | Payer: Managed Care, Other (non HMO) | Attending: Emergency Medicine | Admitting: Emergency Medicine

## 2020-03-11 ENCOUNTER — Emergency Department (HOSPITAL_COMMUNITY): Payer: Managed Care, Other (non HMO)

## 2020-03-11 ENCOUNTER — Other Ambulatory Visit: Payer: Self-pay

## 2020-03-11 DIAGNOSIS — R1030 Lower abdominal pain, unspecified: Secondary | ICD-10-CM | POA: Diagnosis present

## 2020-03-11 DIAGNOSIS — R599 Enlarged lymph nodes, unspecified: Secondary | ICD-10-CM

## 2020-03-11 DIAGNOSIS — Z87891 Personal history of nicotine dependence: Secondary | ICD-10-CM | POA: Insufficient documentation

## 2020-03-11 LAB — CBC WITH DIFFERENTIAL/PLATELET
Abs Immature Granulocytes: 0.02 10*3/uL (ref 0.00–0.07)
Basophils Absolute: 0 10*3/uL (ref 0.0–0.1)
Basophils Relative: 0 %
Eosinophils Absolute: 0.2 10*3/uL (ref 0.0–0.5)
Eosinophils Relative: 2 %
HCT: 44.2 % (ref 39.0–52.0)
Hemoglobin: 15.1 g/dL (ref 13.0–17.0)
Immature Granulocytes: 0 %
Lymphocytes Relative: 18 %
Lymphs Abs: 1.5 10*3/uL (ref 0.7–4.0)
MCH: 31.6 pg (ref 26.0–34.0)
MCHC: 34.2 g/dL (ref 30.0–36.0)
MCV: 92.5 fL (ref 80.0–100.0)
Monocytes Absolute: 0.7 10*3/uL (ref 0.1–1.0)
Monocytes Relative: 9 %
Neutro Abs: 5.7 10*3/uL (ref 1.7–7.7)
Neutrophils Relative %: 71 %
Platelets: 304 10*3/uL (ref 150–400)
RBC: 4.78 MIL/uL (ref 4.22–5.81)
RDW: 11.9 % (ref 11.5–15.5)
WBC: 8.2 10*3/uL (ref 4.0–10.5)
nRBC: 0 % (ref 0.0–0.2)

## 2020-03-11 LAB — URINALYSIS, ROUTINE W REFLEX MICROSCOPIC
Bilirubin Urine: NEGATIVE
Glucose, UA: NEGATIVE mg/dL
Hgb urine dipstick: NEGATIVE
Ketones, ur: NEGATIVE mg/dL
Leukocytes,Ua: NEGATIVE
Nitrite: NEGATIVE
Protein, ur: NEGATIVE mg/dL
Specific Gravity, Urine: >1.046 — ABNORMAL HIGH (ref 1.005–1.030)
pH: 7 (ref 5.0–8.0)

## 2020-03-11 LAB — COMPREHENSIVE METABOLIC PANEL
ALT: 19 U/L (ref 0–44)
AST: 21 U/L (ref 15–41)
Albumin: 3.7 g/dL (ref 3.5–5.0)
Alkaline Phosphatase: 58 U/L (ref 38–126)
Anion gap: 10 (ref 5–15)
BUN: 13 mg/dL (ref 6–20)
CO2: 25 mmol/L (ref 22–32)
Calcium: 9.2 mg/dL (ref 8.9–10.3)
Chloride: 102 mmol/L (ref 98–111)
Creatinine, Ser: 0.93 mg/dL (ref 0.61–1.24)
GFR, Estimated: >60 mL/min (ref >60)
Glucose, Bld: 107 mg/dL — ABNORMAL HIGH (ref 70–99)
Potassium: 4 mmol/L (ref 3.5–5.1)
Sodium: 137 mmol/L (ref 135–145)
Total Bilirubin: 0.5 mg/dL (ref 0.3–1.2)
Total Protein: 6.5 g/dL (ref 6.5–8.1)

## 2020-03-11 MED ORDER — KETOROLAC TROMETHAMINE 30 MG/ML IJ SOLN
15.0000 mg | Freq: Once | INTRAMUSCULAR | Status: AC
  Administered 2020-03-11: 15 mg via INTRAVENOUS
  Filled 2020-03-11: qty 1

## 2020-03-11 MED ORDER — OXYCODONE-ACETAMINOPHEN 5-325 MG PO TABS
2.0000 | ORAL_TABLET | Freq: Once | ORAL | Status: AC
  Administered 2020-03-11: 2 via ORAL
  Filled 2020-03-11: qty 2

## 2020-03-11 MED ORDER — VALACYCLOVIR HCL 1 G PO TABS: 500.0000 mg | ORAL_TABLET | Freq: Two times a day (BID) | ORAL | 0 refills | Status: AC

## 2020-03-11 MED ORDER — IOHEXOL 300 MG/ML  SOLN
100.0000 mL | Freq: Once | INTRAMUSCULAR | Status: AC | PRN
  Administered 2020-03-11: 100 mL via INTRAVENOUS

## 2020-03-11 NOTE — Discharge Instructions (Signed)
Please return for any problem.  Take Valtrex as prescribed when indicated.

## 2020-03-11 NOTE — ED Provider Notes (Signed)
[facility] EMERGENCY DEPARTMENT Provider Note   CSN: [id number] Arrival date & time: 03/11/20  0455     History Chief Complaint  Patient presents with  . Abdominal Pain  . Inguinal Hernia    Seth Stevenson is a 24 y.o. male.  24 year old male with prior medical history as detailed below presents for evaluation of left inguinal pain and bulging.  Patient reports symptoms started on Thursday of this past week.  He does report significant heavy lifting at work.  He reports increased pain over the last day.  He did not attempt to take any medications at home for his pain.  He denies associated nausea, vomiting, abdominal pain, urinary symptoms, penile discharge, bowel movement change, or other complaint.  He denies prior history of hernia.  He denies prior history of STD.  He denies concern for new STD.  The history is provided by the patient and medical records.  Abdominal Pain Pain location: Left inguinal pain. Pain quality: aching   Pain radiates to:  Does not radiate Pain severity:  Mild Onset quality:  Gradual Duration:  4 days Timing:  Constant Progression:  Unchanged Chronicity:  New Relieved by:  Nothing Worsened by:  Nothing      Past Medical History:  Diagnosis Date  . [id number](294.7)     Patient Active Problem List   Diagnosis Date Noted  . Migraine variant 07/08/2012  . Migraine with aura 07/08/2012  . Migraine without aura 07/08/2012  . Episodic tension type headache 07/08/2012    Past Surgical History:  Procedure Laterality Date  . ADENOIDECTOMY Bilateral 2001       Family History  Problem Relation Age of Onset  . Migraines Mother   . Heart attack Mother   . Migraines Maternal Grandmother   . Hypertension Maternal Grandmother   . Heart attack Maternal Grandmother   . Arthritis/Rheumatoid Maternal Grandmother   . Diabetes Maternal Grandfather   . Heart attack Maternal Grandfather   . Arrhythmia Maternal Grandfather   .  Hypertension Father   . Cancer Paternal Grandfather   . Migraines Cousin        maternal first cousin  . Migraines Other        maternal aunt onset in adolescence  . Glaucoma Other        maternal greatgrandfather  . Arrhythmia Maternal Aunt   . Lupus Maternal Aunt     Social History   Tobacco Use  . Smoking status: Former Games developer  . Smokeless tobacco: Never Used  Substance Use Topics  . Alcohol use: Yes    Alcohol/week: 8.0 standard drinks    Types: 8 Shots of liquor per week  . Drug use: No    Home Medications Prior to Admission medications   Medication Sig Start Date End Date Taking? Authorizing Provider  ibuprofen (ADVIL) 600 MG tablet Take 1 tablet (600 mg total) by mouth every 6 (six) hours as needed. 01/27/19   Lamptey, Britta Mccreedy, MD    Allergies    Patient has no known allergies.  Review of Systems   Review of Systems  Gastrointestinal: Positive for abdominal pain.  All other systems reviewed and are negative.   Physical Exam Updated Vital Signs BP (!) 145/61 (BP Location: Left Arm)   Pulse 78   Temp 97.6 F (36.4 C) (Oral)   Resp 16   Ht 5\' 10"  (1.778 m)   Wt 79.4 kg   SpO2 99%   BMI 25.11 kg/m   Physical  Exam Vitals and nursing note reviewed.  Constitutional:      General: He is not in acute distress.    Appearance: He is well-developed.  HENT:     Head: Normocephalic and atraumatic.  Eyes:     Conjunctiva/sclera: Conjunctivae normal.     Pupils: Pupils are equal, round, and reactive to light.  Cardiovascular:     Rate and Rhythm: Normal rate and regular rhythm.     Heart sounds: Normal heart sounds.  Pulmonary:     Effort: Pulmonary effort is normal. No respiratory distress.     Breath sounds: Normal breath sounds.  Abdominal:     General: There is no distension.     Palpations: Abdomen is soft.     Tenderness: There is no abdominal tenderness.  Genitourinary:    Penis: Normal.      Testes: Normal.     Comments: Mild tenderness  overlying the left inguinal region.  Patient with palpable lymph nodes overlying the left inguinal region.  No discrete hernia appreciated on exam.  No nearby erythema or evidence of cellulitis.    No penile discharge.   Musculoskeletal:        General: No deformity. Normal range of motion.     Cervical back: Normal range of motion and neck supple.  Skin:    General: Skin is warm and dry.  Neurological:     Mental Status: He is alert and oriented to person, place, and time.     ED Results / Procedures / Treatments   Labs (all labs ordered are listed, but only abnormal results are displayed) Labs Reviewed  COMPREHENSIVE METABOLIC PANEL - Abnormal; Notable for the following components:      Result Value   Glucose, Bld 107 (*)    All other components within normal limits  CBC WITH DIFFERENTIAL/PLATELET  URINALYSIS, ROUTINE W REFLEX MICROSCOPIC    EKG None  Radiology CT ABDOMEN PELVIS W CONTRAST  Result Date: 03/11/2020 CLINICAL DATA:  Abdominal pain.  Evaluate for hernia. EXAM: CT ABDOMEN AND PELVIS WITH CONTRAST TECHNIQUE: Multidetector CT imaging of the abdomen and pelvis was performed using the standard protocol following bolus administration of intravenous contrast. CONTRAST:  OMNIPAQUE IOHEXOL 300 MG/ML  SOLN COMPARISON:  None FINDINGS: Lower chest: No acute abnormality. Hepatobiliary: No focal liver abnormality is seen. No gallstones, gallbladder wall thickening, or biliary dilatation. Pancreas: Unremarkable. No pancreatic ductal dilatation or surrounding inflammatory changes. Spleen: Normal in size without focal abnormality. Adrenals/Urinary Tract: Normal appearance of the adrenal glands. No hydronephrosis or nephrolithiasis identified inferior pole left kidney cyst measures 1.3 cm. The urinary bladder is unremarkable. Stomach/Bowel: Stomach is within normal limits. Appendix appears normal. No evidence of bowel wall thickening, distention, or inflammatory changes.  Vascular/Lymphatic: No significant vascular findings are present. Enlarged left inguinal lymph nodes are identified measuring up to 1.6 cm and likely accounts for the pain and swelling. Surrounding soft tissue stranding identified. Reproductive: Prostate is unremarkable. Other: No signs of hernia. No free fluid or fluid collections within the abdomen or pelvis. Musculoskeletal: No acute or significant osseous findings. IMPRESSION: 1. No acute findings identified within the abdomen or pelvis. No signs of hernia. 2. Enlarged left inguinal lymph nodes are identified and likely accounts for the pain and swelling. Strongly favored to represent reactive adenopathy. In the absence of a known malignancy metastatic adenopathy is considered less favored. Electronically Signed   By: Signa Kell M.D.   On: 03/11/2020 07:37    Procedures Procedures (including critical  care time)  Medications Ordered in ED Medications  oxyCODONE-acetaminophen (PERCOCET/ROXICET) 5-325 MG per tablet 2 tablet (2 tablets Oral Given 03/11/20 0546)  iohexol (OMNIPAQUE) 300 MG/ML solution 100 mL (100 mLs Intravenous Contrast Given 03/11/20 0654)  ketorolac (TORADOL) 30 MG/ML injection 15 mg (15 mg Intravenous Given 03/11/20 7564)    ED Course  I have reviewed the triage vital signs and the nursing notes.  Pertinent labs & imaging results that were available during my care of the patient were reviewed by me and considered in my medical decision making (see chart for details).    MDM Rules/Calculators/A&P                          MDM  Screen complete  Seth Stevenson was evaluated in Emergency Department on 03/11/2020 for the symptoms described in the history of present illness. He was evaluated in the context of the global COVID-19 pandemic, which necessitated consideration that the patient might be at risk for infection with the SARS-CoV-2 virus that causes COVID-19. Institutional protocols and algorithms that pertain to the  evaluation of patients at risk for COVID-19 are in a state of rapid change based on information released by regulatory bodies including the CDC and federal and state organizations. These policies and algorithms were followed during the patient's care in the ED.  Patient is presenting for evaluation of pain and tenderness and swelling in the left inguinal region.  Patient's work-up is demonstrative of likely reactive lymph nodes in the left inguinal area.  Patient does not have inguinal hernia.  Screening labs otherwise without significant abnormality.    After initial evaluation patient did report recent diagnosis and treatment for genital herpes.  Patient is not currently taking Valtrex.  Patient is without noted herpetic lesion to the genitals.  Patient's reactive lymphadenopathy could be secondary to persistent herpetic infection.  Patient is advised to use Valtrex appropriately.  Importance of close follow-up was stressed.  Patient without symptoms consistent with other STD infection.  Will screen with GC / chlamydia cultures.   Patient is appropriate for discharge.  Importance of close follow-up is stressed.  Strict return precautions given and understood.  Final Clinical Impression(s) / ED Diagnoses Final diagnoses:  Reactive lymphadenopathy    Rx / DC Orders ED Discharge Orders         Ordered    valACYclovir (VALTREX) 1000 MG tablet  2 times daily        03/11/20 0915           Wynetta Fines, MD 03/11/20 [phone]

## 2020-03-11 NOTE — ED Triage Notes (Signed)
  Patient comes in with lower abdominal pain that has been going on for about 3 days.  Patient has L inguinal hernia that patient said started yesterday.  Pain 9/10.

## 2020-03-11 NOTE — ED Notes (Signed)
Patient verbalized understanding of discharge instructions. Opportunity for questions and answers.  

## 2020-03-11 NOTE — ED Triage Notes (Signed)
Emergency Medicine Provider Triage Evaluation Note  Seth Stevenson , a 24 y.o. male  was evaluated in triage.  Pt complains of left inguinal pain that began Thursday evening, worsening since that time.  He does a lot of frequent, heavy lifting at work.  Tried to make it to UC this morning but pain worsened.  Pain notably worse when standing up, better sitting and pressure applied.  Does have notable bulge.  No fever, vomiting, diarrhea.  No testicle pain, trouble urinating, etc.  Review of Systems  Positive: Left lower abdominal pain Negative: Vomiting, diarrhea, fever  Physical Exam  Ht 5\' 10"  (1.778 m)   Wt 79.4 kg   BMI 25.11 kg/m  Gen:   Awake, no distress   HEENT:  Atraumatic  Resp:  Normal effort  Cardiac:  Normal rate  Abd:   Nondistended, nontender, bulge noted left inguinal area, very tender to palpation MSK:   Moves extremities without difficulty Neuro:  Speech clear  Medical Decision Making  Medically screening exam initiated at 5:19 AM.  Appropriate orders placed.  Seth Stevenson was informed that the remainder of the evaluation will be completed by another provider, this initial triage assessment does not replace that evaluation, and the importance of remaining in the ED until their evaluation is complete.  Clinical Impression   Probable new left inguinal hernia.  Will obtain labs, CT scan to ensure no signs of incarceration given acutely increased pain.  Given percocet for pain.     Toma Copier, PA-C 03/11/20 [phone]

## 2020-03-12 LAB — GC/CHLAMYDIA PROBE AMP (~~LOC~~) NOT AT ARMC
Chlamydia: NEGATIVE
Comment: NEGATIVE
Comment: NORMAL
Neisseria Gonorrhea: NEGATIVE

## 2020-11-05 ENCOUNTER — Emergency Department (HOSPITAL_COMMUNITY)
Admission: EM | Admit: 2020-11-05 | Discharge: 2020-11-06 | Disposition: A | Discharge status: Home / Self Care | Payer: Managed Care, Other (non HMO) | Attending: Emergency Medicine | Admitting: Emergency Medicine

## 2020-11-05 ENCOUNTER — Emergency Department (HOSPITAL_COMMUNITY): Payer: Managed Care, Other (non HMO)

## 2020-11-05 ENCOUNTER — Encounter (HOSPITAL_COMMUNITY): Payer: Self-pay | Admitting: Emergency Medicine

## 2020-11-05 ENCOUNTER — Other Ambulatory Visit: Payer: Self-pay

## 2020-11-05 DIAGNOSIS — Y92410 Unspecified street and highway as the place of occurrence of the external cause: Secondary | ICD-10-CM | POA: Diagnosis not present

## 2020-11-05 DIAGNOSIS — S3993XA Unspecified injury of pelvis, initial encounter: Secondary | ICD-10-CM | POA: Diagnosis not present

## 2020-11-05 DIAGNOSIS — R55 Syncope and collapse: Secondary | ICD-10-CM | POA: Diagnosis not present

## 2020-11-05 DIAGNOSIS — R079 Chest pain, unspecified: Secondary | ICD-10-CM | POA: Insufficient documentation

## 2020-11-05 DIAGNOSIS — Z20822 Contact with and (suspected) exposure to covid-19: Secondary | ICD-10-CM | POA: Insufficient documentation

## 2020-11-05 DIAGNOSIS — Z87891 Personal history of nicotine dependence: Secondary | ICD-10-CM | POA: Diagnosis not present

## 2020-11-05 DIAGNOSIS — S3991XA Unspecified injury of abdomen, initial encounter: Secondary | ICD-10-CM | POA: Diagnosis not present

## 2020-11-05 DIAGNOSIS — S27321A Contusion of lung, unilateral, initial encounter: Secondary | ICD-10-CM | POA: Diagnosis not present

## 2020-11-05 DIAGNOSIS — R630 Anorexia: Secondary | ICD-10-CM | POA: Diagnosis not present

## 2020-11-05 DIAGNOSIS — S27301A Unspecified injury of lung, unilateral, initial encounter: Secondary | ICD-10-CM | POA: Diagnosis present

## 2020-11-05 DIAGNOSIS — S24109A Unspecified injury at unspecified level of thoracic spinal cord, initial encounter: Secondary | ICD-10-CM | POA: Diagnosis not present

## 2020-11-05 DIAGNOSIS — T1490XA Injury, unspecified, initial encounter: Secondary | ICD-10-CM

## 2020-11-05 DIAGNOSIS — R000 Tachycardia, unspecified: Secondary | ICD-10-CM | POA: Insufficient documentation

## 2020-11-05 LAB — CBC WITH DIFFERENTIAL/PLATELET
Abs Immature Granulocytes: 0.02 10*3/uL (ref 0.00–0.07)
Basophils Absolute: 0.1 10*3/uL (ref 0.0–0.1)
Basophils Relative: 1 %
Eosinophils Absolute: 0.1 10*3/uL (ref 0.0–0.5)
Eosinophils Relative: 2 %
HCT: 47.1 % (ref 39.0–52.0)
Hemoglobin: 16.2 g/dL (ref 13.0–17.0)
Immature Granulocytes: 1 %
Lymphocytes Relative: 40 %
Lymphs Abs: 1.7 10*3/uL (ref 0.7–4.0)
MCH: 31.2 pg (ref 26.0–34.0)
MCHC: 34.4 g/dL (ref 30.0–36.0)
MCV: 90.8 fL (ref 80.0–100.0)
Monocytes Absolute: 0.4 10*3/uL (ref 0.1–1.0)
Monocytes Relative: 9 %
Neutro Abs: 2 10*3/uL (ref 1.7–7.7)
Neutrophils Relative %: 47 %
Platelets: 287 10*3/uL (ref 150–400)
RBC: 5.19 MIL/uL (ref 4.22–5.81)
RDW: 11.5 % (ref 11.5–15.5)
WBC: 4.2 10*3/uL (ref 4.0–10.5)
nRBC: 0 % (ref 0.0–0.2)

## 2020-11-05 LAB — I-STAT CHEM 8, ED
BUN: 17 mg/dL (ref 6–20)
Calcium, Ion: 1.23 mmol/L (ref 1.15–1.40)
Chloride: 103 mmol/L (ref 98–111)
Creatinine, Ser: 1 mg/dL (ref 0.61–1.24)
Glucose, Bld: 102 mg/dL — ABNORMAL HIGH (ref 70–99)
HCT: 48 % (ref 39.0–52.0)
Hemoglobin: 16.3 g/dL (ref 13.0–17.0)
Potassium: 3.5 mmol/L (ref 3.5–5.1)
Sodium: 139 mmol/L (ref 135–145)
TCO2: 24 mmol/L (ref 22–32)

## 2020-11-05 LAB — COMPREHENSIVE METABOLIC PANEL
ALT: 48 U/L — ABNORMAL HIGH (ref 0–44)
AST: 50 U/L — ABNORMAL HIGH (ref 15–41)
Albumin: 4.1 g/dL (ref 3.5–5.0)
Alkaline Phosphatase: 54 U/L (ref 38–126)
Anion gap: 10 (ref 5–15)
BUN: 16 mg/dL (ref 6–20)
CO2: 22 mmol/L (ref 22–32)
Calcium: 9.6 mg/dL (ref 8.9–10.3)
Chloride: 104 mmol/L (ref 98–111)
Creatinine, Ser: 1.03 mg/dL (ref 0.61–1.24)
GFR, Estimated: >60 mL/min (ref >60)
Glucose, Bld: 106 mg/dL — ABNORMAL HIGH (ref 70–99)
Potassium: 3.6 mmol/L (ref 3.5–5.1)
Sodium: 136 mmol/L (ref 135–145)
Total Bilirubin: 0.8 mg/dL (ref 0.3–1.2)
Total Protein: 7.2 g/dL (ref 6.5–8.1)

## 2020-11-05 LAB — ABO/RH: ABO/RH(D): A POS

## 2020-11-05 LAB — TYPE AND SCREEN
ABO/RH(D): A POS
Antibody Screen: NEGATIVE

## 2020-11-05 LAB — LACTIC ACID, PLASMA: Lactic Acid, Venous: 1.8 mmol/L (ref 0.5–1.9)

## 2020-11-05 LAB — PROTIME-INR
INR: 1 (ref 0.8–1.2)
Prothrombin Time: 13.6 seconds (ref 11.4–15.2)

## 2020-11-05 LAB — ETHANOL: Alcohol, Ethyl (B): <10 mg/dL (ref <10)
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
## 2020-11-05 NOTE — ED Notes (Signed)
Fast negative 
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
## 2020-11-05 NOTE — Progress Notes (Signed)
Chaplain encountered patient at ED  desk.  He was denying need for treatment and wished to check on status of family members. Personnel spoke to him about need for his care as well.  Son, 24 yo, is in [facility]. His baby's mother is a patient in room #27.  Her name is Seth Stevenson.  She is 21 and was just upgraded to Level 1.  Chaplain following.  Rev. Lynnell Chad Pager [phone]

## 2020-11-06 DIAGNOSIS — S27321A Contusion of lung, unilateral, initial encounter: Secondary | ICD-10-CM | POA: Diagnosis not present

## 2020-11-06 LAB — RESP PANEL BY RT-PCR (FLU A&B, COVID) ARPGX2
Influenza A by PCR: NEGATIVE
Influenza B by PCR: NEGATIVE
SARS Coronavirus 2 by RT PCR: NEGATIVE

## 2020-11-06 MED ORDER — IOHEXOL 300 MG/ML  SOLN
100.0000 mL | Freq: Once | INTRAMUSCULAR | Status: AC | PRN
  Administered 2020-11-06: 100 mL via INTRAVENOUS

## 2020-11-06 MED ORDER — ONDANSETRON 4 MG PO TBDP
4.0000 mg | ORAL_TABLET | Freq: Once | ORAL | Status: AC
  Administered 2020-11-06: 4 mg via ORAL
  Filled 2020-11-06: qty 1

## 2020-11-06 MED ORDER — IBUPROFEN 800 MG PO TABS: 800.0000 mg | ORAL_TABLET | Freq: Three times a day (TID) | ORAL | 0 refills | Status: DC

## 2020-11-06 MED ORDER — METHOCARBAMOL 500 MG PO TABS: 500.0000 mg | ORAL_TABLET | Freq: Two times a day (BID) | ORAL | 0 refills | Status: DC

## 2020-11-06 MED ORDER — OXYCODONE-ACETAMINOPHEN 5-325 MG PO TABS
1.0000 | ORAL_TABLET | Freq: Once | ORAL | Status: AC
  Administered 2020-11-06: 1 via ORAL
  Filled 2020-11-06: qty 1

## 2020-11-06 MED ORDER — ONDANSETRON 4 MG PO TBDP: ORAL_TABLET | ORAL | 0 refills | Status: AC

## 2020-11-06 MED ORDER — OXYCODONE-ACETAMINOPHEN 5-325 MG PO TABS
2.0000 | ORAL_TABLET | Freq: Once | ORAL | Status: AC
  Administered 2020-11-06: 2 via ORAL
  Filled 2020-11-06: qty 2

## 2020-11-06 MED ORDER — OXYCODONE HCL 5 MG PO TABS: 5.0000 mg | ORAL_TABLET | Freq: Four times a day (QID) | ORAL | 0 refills | Status: DC | PRN

## 2020-11-06 NOTE — Discharge Instructions (Addendum)
1. Medications: Percocet for pain control, robaxin for muscle spasms, usual home medications 2. Treatment: rest, drink plenty of fluids, incentive spirometry as instructed 3. Follow Up: Please followup with your primary doctor in 2-3 days for discussion of your diagnoses and further evaluation after today's visit; if you do not have a primary care doctor use the resource guide provided to find one; Please return to the ER for fevers, chills, difficulty breathing, worsening pain or other concerns.

## 2020-11-06 NOTE — ED Notes (Signed)
Pt tried to take one oxycodone and began throwing up - PA notified. Zofran put in. Pt states that he feels much better. Per PA give Zofran then try the other oxycodone

## 2020-11-06 NOTE — ED Notes (Signed)
Pt given incentive spirometer.

## 2020-11-06 NOTE — ED Notes (Signed)
PA at bedside.

## 2020-11-06 NOTE — ED Notes (Signed)
Patient verbalizes understanding of discharge instructions. Opportunity for questioning and answers were provided. Armband removed by staff, pt discharged from ED ambulatory.   
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
## 2020-11-06 NOTE — ED Notes (Signed)
PT AMBULATED WITH AN SPO2 OF 95%

## 2020-11-06 NOTE — ED Notes (Signed)
Verbal order by PA at bedside for one additional Oxycodone pill since pt threw up the first

## 2020-11-06 NOTE — ED Notes (Signed)
Family at bedside. 

## 2020-11-06 NOTE — ED Notes (Signed)
Pt able to tolerate water.

## 2020-12-03 ENCOUNTER — Other Ambulatory Visit: Payer: Self-pay

## 2020-12-03 ENCOUNTER — Encounter (HOSPITAL_COMMUNITY): Payer: Self-pay

## 2020-12-03 ENCOUNTER — Ambulatory Visit (HOSPITAL_COMMUNITY)
Admission: EM | Admit: 2020-12-03 | Discharge: 2020-12-03 | Disposition: A | Discharge status: Home / Self Care | Payer: 59 | Attending: Internal Medicine | Admitting: Internal Medicine

## 2020-12-03 DIAGNOSIS — M546 Pain in thoracic spine: Secondary | ICD-10-CM

## 2020-12-03 MED ORDER — IBUPROFEN 800 MG PO TABS: 800.0000 mg | ORAL_TABLET | Freq: Three times a day (TID) | ORAL | 0 refills | Status: AC | PRN

## 2020-12-03 MED ORDER — METHOCARBAMOL 500 MG PO TABS: 500.0000 mg | ORAL_TABLET | Freq: Two times a day (BID) | ORAL | 0 refills | Status: AC

## 2020-12-03 NOTE — Discharge Instructions (Addendum)
Please go to the occupational health duration for further evaluation regarding your symptoms. Your medications have been sent to the pharmacy to be picked up Occupational health will help with job-related recommendations.
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
